# Patient Record
Sex: Female | Born: 1955 | Race: Black or African American | Hispanic: No | Smoking: Never smoker
Health system: Southern US, Community
[De-identification: ages and names within clinical notes are randomized; demographics above are authoritative.]

## PROBLEM LIST (undated history)

## (undated) DIAGNOSIS — I10 Essential (primary) hypertension: Secondary | ICD-10-CM

## (undated) HISTORY — PX: BREAST EXCISIONAL BIOPSY: SUR124

---

## 2004-02-20 ENCOUNTER — Ambulatory Visit (HOSPITAL_COMMUNITY): Admission: RE | Admit: 2004-02-20 | Discharge: 2004-02-20 | Payer: Self-pay | Admitting: Family Medicine

## 2008-08-20 ENCOUNTER — Emergency Department (HOSPITAL_COMMUNITY): Admission: EM | Admit: 2008-08-20 | Discharge: 2008-08-20 | Payer: Self-pay | Admitting: Emergency Medicine

## 2008-09-16 ENCOUNTER — Ambulatory Visit (HOSPITAL_COMMUNITY): Admission: RE | Admit: 2008-09-16 | Discharge: 2008-09-16 | Payer: Self-pay | Admitting: Gastroenterology

## 2008-09-16 ENCOUNTER — Encounter (INDEPENDENT_AMBULATORY_CARE_PROVIDER_SITE_OTHER): Payer: Self-pay | Admitting: Gastroenterology

## 2008-12-02 ENCOUNTER — Encounter (INDEPENDENT_AMBULATORY_CARE_PROVIDER_SITE_OTHER): Payer: Self-pay | Admitting: Obstetrics and Gynecology

## 2008-12-02 ENCOUNTER — Ambulatory Visit (HOSPITAL_COMMUNITY): Admission: RE | Admit: 2008-12-02 | Discharge: 2008-12-02 | Payer: Self-pay | Admitting: Obstetrics and Gynecology

## 2009-06-26 ENCOUNTER — Emergency Department (HOSPITAL_COMMUNITY): Admission: EM | Admit: 2009-06-26 | Discharge: 2009-06-26 | Payer: Self-pay | Admitting: Family Medicine

## 2010-09-20 ENCOUNTER — Other Ambulatory Visit: Payer: Self-pay | Admitting: Family Medicine

## 2010-09-20 DIAGNOSIS — Z1231 Encounter for screening mammogram for malignant neoplasm of breast: Secondary | ICD-10-CM

## 2010-11-14 LAB — CBC
HCT: 34.2 % — ABNORMAL LOW (ref 36.0–46.0)
Hemoglobin: 11.4 g/dL — ABNORMAL LOW (ref 12.0–15.0)
MCHC: 33.5 g/dL (ref 30.0–36.0)
MCV: 78.5 fL (ref 78.0–100.0)
RBC: 4.36 MIL/uL (ref 3.87–5.11)
WBC: 6.1 10*3/uL (ref 4.0–10.5)

## 2010-11-14 LAB — BASIC METABOLIC PANEL
CO2: 29 mEq/L (ref 19–32)
Calcium: 9.5 mg/dL (ref 8.4–10.5)
Chloride: 105 mEq/L (ref 96–112)
GFR calc Af Amer: >60 mL/min (ref >60)
GFR calc non Af Amer: >60 mL/min (ref >60)
Sodium: 138 mEq/L (ref 135–145)

## 2010-11-14 LAB — HCG, SERUM, QUALITATIVE: Preg, Serum: NEGATIVE

## 2010-11-19 LAB — URINE MICROSCOPIC-ADD ON

## 2010-11-19 LAB — CBC
HCT: 37.7 % (ref 36.0–46.0)
Hemoglobin: 12 g/dL (ref 12.0–15.0)
MCHC: 31.9 g/dL (ref 30.0–36.0)
Platelets: 226 10*3/uL (ref 150–400)
RBC: 4.76 MIL/uL (ref 3.87–5.11)

## 2010-11-19 LAB — URINALYSIS, ROUTINE W REFLEX MICROSCOPIC
Glucose, UA: NEGATIVE mg/dL
Glucose, UA: NEGATIVE mg/dL
Ketones, ur: 15 mg/dL — AB
Nitrite: NEGATIVE
Nitrite: NEGATIVE
Urobilinogen, UA: 1 mg/dL (ref 0.0–1.0)

## 2010-11-19 LAB — POCT I-STAT, CHEM 8
Calcium, Ion: 1.19 mmol/L (ref 1.12–1.32)
Glucose, Bld: 111 mg/dL — ABNORMAL HIGH (ref 70–99)
Sodium: 138 mEq/L (ref 135–145)
TCO2: 26 mmol/L (ref 0–100)

## 2010-11-19 LAB — DIFFERENTIAL
Eosinophils Absolute: 0.1 10*3/uL (ref 0.0–0.7)
Eosinophils Relative: 1 % (ref 0–5)
Monocytes Relative: 5 % (ref 3–12)
Neutrophils Relative %: 82 % — ABNORMAL HIGH (ref 43–77)

## 2010-12-18 NOTE — Op Note (Signed)
NAME:  Kimberly Parker, Kimberly Parker NO.:  [id number]   MEDICAL RECORD NO.:  [id number]          PATIENT TYPE:  AMB   LOCATION:  SDC                           FACILITY:  [facility]   PHYSICIAN:  Osborn Coho, M.D.   DATE OF BIRTH:  1956/03/21   DATE OF PROCEDURE:  12/02/2008  DATE OF DISCHARGE:                               OPERATIVE REPORT   PREOPERATIVE DIAGNOSES:  1. Menorrhagia.  2. Dysmenorrhea.  3. Fibroids.  4. Polyp.   POSTOPERATIVE DIAGNOSES:  1. Menorrhagia.  2. Dysmenorrhea.  3. Fibroids.  4. Polyp.   PROCEDURE:  1. Hysteroscopy.  2. D and C.  3. Hydrothermal ablation.   ATTENDING DOCTOR:  Osborn Coho, MD   ANESTHESIA:  General via LMA.   FINDINGS:  Approximately 2 cm submucosal fibroid in appearance on the  anterior uterine wall.   SPECIMEN TO PATHOLOGY:  Endometrial curettings.   FLUIDS:  500 mL.   URINE OUTPUT:  Quantity sufficient via straight cath prior to procedure.   ESTIMATED BLOOD LOSS:  Minimal.   COMPLICATIONS:  None.   PROCEDURE:  The patient was taken to the operating room after risks,  benefits, and alternatives discussed with the patient.  The patient  verbalized understanding, consent signed and witnessed.  The patient was  placed under general anesthesia and prepped and draped in the normal  sterile fashion in the dorsolithotomy position.  A bivalve speculum was  placed in the patient's vagina and the anterior lip of the cervix  grasped with single-tooth tenaculum.  A paracervical block was  administered using a total of 10 mL of 1% lidocaine.  The uterus sounded  to approximate 11 cm and the cervix was dilated for passage of the  hydrothermal ablation hysteroscope.  The hysteroscope was introduced and  submucosal fibroid in appearance as noted above.  The instrument was  removed and curettage was performed and curettings sent to pathology.  The hydrothermal ablation instrument was introduced again into the  uterine cavity and  ablation performed without difficulty.  There was  small area in the fundus which did not appear to be ablated on the  initial portion of the procedure which was then ablated with an  additional 4 minutes of the procedure.  Good results were noted.  All  instruments were removed and good hemostasis at tenaculum site after  pressure applied.  Count was correct.  The patient tolerated the  procedure well and is currently awaiting transfer to the recovery room  in good condition.       Osborn Coho, M.D.  Electronically Signed     AR/MEDQ  D:  12/02/2008  T:  12/03/2008  Job:  [id number]

## 2010-12-18 NOTE — Op Note (Signed)
NAME:  Kimberly Parker, Kimberly Parker NO.:  [id number]   MEDICAL RECORD NO.:  [id number]          PATIENT TYPE:  AMB   LOCATION:  ENDO                         FACILITY:  [facility]   PHYSICIAN:  Bernette Redbird, M.D.   DATE OF BIRTH:  1956-01-19   DATE OF PROCEDURE:  09/16/2008  DATE OF DISCHARGE:                               OPERATIVE REPORT   PROCEDURE:  Colonoscopy with biopsy.   INDICATIONS:  Initial colon cancer screening examination in this 55-year-  old [place] American female who has a family history of colon cancer in  her mother at an advanced age.   FINDINGS:  Essentially normal exam.  Tiny rectal polyp biopsied.   PROCEDURE:  The nature, purpose, and risks of the procedure had been  discussed with the patient who provided written consent.  Sedation was  fentanyl 75 mcg and Versed 7.5 mg IV without arrhythmias or  desaturation.   The Pentax pediatric video colonoscope was advanced with some looping,  overcome by turning the patient into the supine position and using some  external abdominal compression.  The terminal ileum was entered and  looked normal and pullback was then performed.  The appendiceal orifice  was clearly identified as well.   There was a tiny 2-3 mm sessile polyp of the proximal rectum removed by  a single cold biopsy.  No other polyps were seen and there was no  evidence of cancer, colitis, vascular malformations or diverticulosis.  Retroflexion in the rectum was unremarkable.  The patient tolerated the  procedure well and there were no apparent complications.   IMPRESSION:  Essentially normal colonoscopy in a patient with a family  history of colon cancer.  Diminutive rectal polyp biopsied as described  above.   PLAN:  Await pathology results.  Anticipate repeat colonoscopy in 5  years in view of the family history.           ______________________________  Bernette Redbird, M.D.     RB/MEDQ  D:  09/16/2008  T:  09/17/2008  Job:   [id number]   cc:   Carren Rang, M.D.

## 2013-11-18 ENCOUNTER — Encounter (HOSPITAL_COMMUNITY): Payer: Self-pay | Admitting: Emergency Medicine

## 2013-11-18 ENCOUNTER — Emergency Department (HOSPITAL_COMMUNITY)
Admission: EM | Admit: 2013-11-18 | Discharge: 2013-11-18 | Disposition: A | Discharge status: Home / Self Care | Payer: 59 | Source: Home / Self Care | Attending: Family Medicine | Admitting: Family Medicine

## 2013-11-18 DIAGNOSIS — M76899 Other specified enthesopathies of unspecified lower limb, excluding foot: Secondary | ICD-10-CM

## 2013-11-18 DIAGNOSIS — M7061 Trochanteric bursitis, right hip: Secondary | ICD-10-CM

## 2013-11-18 MED ORDER — PREDNISONE 10 MG PO KIT: PACK | ORAL | Status: AC

## 2013-11-18 NOTE — ED Notes (Signed)
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

## 2013-11-18 NOTE — Discharge Instructions (Signed)
Thank you for coming in today. Do the exercises we talked about multiple times a day.  Take prednisone as directed Followup with Dr. [facility] if not getting better  Hip Bursitis Bursitis is a swelling and soreness (inflammation) of a fluid-filled sac (bursa). This sac overlies and protects the joints.  CAUSES   Injury.  Overuse of the muscles surrounding the joint.  Arthritis.  Gout.  Infection.  Cold weather.  Inadequate warm-up and conditioning prior to activities. The cause may not be known.  SYMPTOMS   Mild to severe irritation.  Tenderness and swelling over the outside of the hip.  Pain with motion of the hip.  If the bursa becomes infected, a fever may be present. Redness, tenderness, and warmth will develop over the hip. Symptoms usually lessen in 3 to 4 weeks with treatment, but can come back. TREATMENT If conservative treatment does not work, your caregiver may advise draining the bursa and injecting cortisone into the area. This may speed up the healing process. This may also be used as an initial treatment of choice. HOME CARE INSTRUCTIONS   Apply ice to the affected area for 15-20 minutes every 3 to 4 hours while awake for the first 2 days. Put the ice in a plastic bag and place a towel between the bag of ice and your skin.  Rest the painful joint as much as possible, but continue to put the joint through a normal range of motion at least 4 times per day. When the pain lessens, begin normal, slow movements and usual activities to help prevent stiffness of the hip.  Only take over-the-counter or prescription medicines for pain, discomfort, or fever as directed by your caregiver.  Use crutches to limit weight bearing on the hip joint, if advised.  Elevate your painful hip to reduce swelling. Use pillows for propping and cushioning your legs and hips.  Gentle massage may provide comfort and decrease swelling. SEEK IMMEDIATE MEDICAL CARE  IF:   Your pain increases even during treatment, or you are not improving.  You have a fever.  You have heat and inflammation over the involved bursa.  You have any other questions or concerns. MAKE SURE YOU:   Understand these instructions.  Will watch your condition.  Will get help right away if you are not doing well or get worse. Document Released: 01/11/2002 Document Revised: 10/14/2011 Document Reviewed: 08/10/2008 [facility] Patient Information 2014 [employer] [place].

## 2013-11-18 NOTE — ED Provider Notes (Signed)
Kimberly Parker is a 58 y.o. female who presents to Urgent Care today for right lateral hip pain present for 2 days without injury. Pain is worse when rising from a seated position, climbing stairs, and when laying on the right side. No radiating pain weakness or numbness. No medications tried. Patient feels well otherwise. Pain is moderate.   History reviewed. No pertinent past medical history. History  Substance Use Topics  . Smoking status: Not on file  . Smokeless tobacco: Not on file  . Alcohol Use: Not on file   ROS as above Medications: No current facility-administered medications for this encounter.   Current Outpatient Prescriptions  Medication Sig Dispense Refill  . HYDROCHLOROTHIAZIDE PO Take by mouth.      . METOPROLOL TARTRATE PO Take by mouth.      . PredniSONE 10 MG KIT 12 day dose pack po  1 kit  0    Exam:  BP 165/67  Pulse 75  Temp(Src) 98.4 F (36.9 C) (Oral)  Resp 18  SpO2 99% Gen: Well NAD Hip: Right: Normal-appearing full range of motion. Tender palpation right greater trochanter. Hip abduction strength 4/5 Left: Normal-appearing full range of motion. Nontender. Strength 4+/5 abduction.  Capillary refill sensation are intact distally bilateral extremities Normal gait  No results found for this or any previous visit (from the past 24 hour(s)). No results found.  Assessment and Plan: 58 y.o. female with right greater trochanteric bursitis. Plan to treat with prednisone, and home exercise program. Followup as needed.  Discussed warning signs or symptoms. Please see discharge instructions. Patient expresses understanding.    Gregor Hams, MD 11/18/13 [phone]

## 2014-09-30 ENCOUNTER — Other Ambulatory Visit: Payer: Self-pay

## 2014-09-30 DIAGNOSIS — Z1231 Encounter for screening mammogram for malignant neoplasm of breast: Secondary | ICD-10-CM

## 2014-10-10 ENCOUNTER — Other Ambulatory Visit (HOSPITAL_COMMUNITY): Payer: Self-pay | Admitting: Emergency Medicine

## 2014-10-10 DIAGNOSIS — E0590 Thyrotoxicosis, unspecified without thyrotoxic crisis or storm: Secondary | ICD-10-CM

## 2014-10-13 ENCOUNTER — Ambulatory Visit (HOSPITAL_COMMUNITY): Payer: 59

## 2014-10-14 ENCOUNTER — Other Ambulatory Visit (HOSPITAL_COMMUNITY): Payer: 59

## 2014-10-27 ENCOUNTER — Encounter (HOSPITAL_COMMUNITY)
Admission: RE | Admit: 2014-10-27 | Discharge: 2014-10-27 | Disposition: A | Discharge status: Home / Self Care | Payer: 59 | Source: Ambulatory Visit | Attending: Emergency Medicine | Admitting: Emergency Medicine

## 2014-10-27 DIAGNOSIS — E0590 Thyrotoxicosis, unspecified without thyrotoxic crisis or storm: Secondary | ICD-10-CM | POA: Insufficient documentation

## 2014-10-27 MED ORDER — SODIUM IODIDE I 131 CAPSULE
10.2000 | Freq: Once | INTRAVENOUS | Status: AC | PRN
  Administered 2014-10-27: 10.2 via ORAL

## 2014-10-28 ENCOUNTER — Ambulatory Visit
Admission: RE | Admit: 2014-10-28 | Discharge: 2014-10-28 | Disposition: A | Discharge status: Home / Self Care | Payer: 59 | Source: Ambulatory Visit

## 2014-10-28 ENCOUNTER — Encounter (HOSPITAL_COMMUNITY)
Admission: RE | Admit: 2014-10-28 | Discharge: 2014-10-28 | Disposition: A | Discharge status: Home / Self Care | Payer: 59 | Source: Ambulatory Visit | Attending: Emergency Medicine | Admitting: Emergency Medicine

## 2014-10-28 DIAGNOSIS — E0590 Thyrotoxicosis, unspecified without thyrotoxic crisis or storm: Secondary | ICD-10-CM | POA: Diagnosis present

## 2014-10-28 DIAGNOSIS — Z1231 Encounter for screening mammogram for malignant neoplasm of breast: Secondary | ICD-10-CM

## 2014-10-28 MED ORDER — SODIUM PERTECHNETATE TC 99M INJECTION
10.0000 | Freq: Once | INTRAVENOUS | Status: AC | PRN
  Administered 2014-10-28: 10 via INTRAVENOUS

## 2015-08-16 MED FILL — HYDROCHLOROTHIAZIDE 25 MG T: 25 | 90 days supply | Qty: 90 | Fill #3

## 2015-09-18 ENCOUNTER — Other Ambulatory Visit: Payer: Self-pay

## 2015-09-18 DIAGNOSIS — Z1231 Encounter for screening mammogram for malignant neoplasm of breast: Secondary | ICD-10-CM

## 2015-10-14 DIAGNOSIS — Z13228 Encounter for screening for other metabolic disorders: Secondary | ICD-10-CM | POA: Diagnosis not present

## 2015-10-14 DIAGNOSIS — Z0001 Encounter for general adult medical examination with abnormal findings: Secondary | ICD-10-CM | POA: Diagnosis not present

## 2015-10-14 DIAGNOSIS — I10 Essential (primary) hypertension: Secondary | ICD-10-CM | POA: Diagnosis not present

## 2015-10-14 DIAGNOSIS — E559 Vitamin D deficiency, unspecified: Secondary | ICD-10-CM | POA: Diagnosis not present

## 2015-10-16 MED FILL — METOPROLOL TARTRATE 50 MG T: 50 | 90 days supply | Qty: 90 | Fill #0

## 2015-11-14 MED FILL — HYDROCHLOROTHIAZIDE 25 MG T: 25 | 90 days supply | Qty: 90 | Fill #0

## 2015-11-16 ENCOUNTER — Ambulatory Visit
Admission: RE | Admit: 2015-11-16 | Discharge: 2015-11-16 | Disposition: A | Discharge status: Home / Self Care | Payer: 59 | Source: Ambulatory Visit

## 2015-11-16 DIAGNOSIS — Z1231 Encounter for screening mammogram for malignant neoplasm of breast: Secondary | ICD-10-CM | POA: Diagnosis not present

## 2016-01-15 MED FILL — METOPROLOL TARTRATE 50 MG T: 50 | 90 days supply | Qty: 90 | Fill #1

## 2016-01-18 DIAGNOSIS — H524 Presbyopia: Secondary | ICD-10-CM | POA: Diagnosis not present

## 2016-02-20 MED FILL — HYDROCHLOROTHIAZIDE 25 MG T: 25 | 90 days supply | Qty: 90 | Fill #1

## 2016-02-26 ENCOUNTER — Ambulatory Visit (HOSPITAL_COMMUNITY)
Admission: EM | Admit: 2016-02-26 | Discharge: 2016-02-26 | Disposition: A | Discharge status: Home / Self Care | Payer: 59 | Attending: Emergency Medicine | Admitting: Emergency Medicine

## 2016-02-26 ENCOUNTER — Encounter (HOSPITAL_COMMUNITY): Payer: Self-pay | Admitting: *Deleted

## 2016-02-26 DIAGNOSIS — M79604 Pain in right leg: Secondary | ICD-10-CM

## 2016-02-26 DIAGNOSIS — M79609 Pain in unspecified limb: Secondary | ICD-10-CM

## 2016-02-26 DIAGNOSIS — M1990 Unspecified osteoarthritis, unspecified site: Secondary | ICD-10-CM | POA: Diagnosis not present

## 2016-02-26 DIAGNOSIS — M79605 Pain in left leg: Secondary | ICD-10-CM

## 2016-02-26 HISTORY — DX: Essential (primary) hypertension: I10

## 2016-02-26 MED ORDER — KETOROLAC TROMETHAMINE 60 MG/2ML IM SOLN
INTRAMUSCULAR | Status: AC
  Filled 2016-02-26: qty 2

## 2016-02-26 MED ORDER — NAPROXEN 500 MG PO TABS: 500.0000 mg | ORAL_TABLET | Freq: Two times a day (BID) | ORAL | 0 refills | Status: AC

## 2016-02-26 MED ORDER — NAPROXEN 500 MG PO TABS
500.0000 mg | ORAL_TABLET | Freq: Two times a day (BID) | ORAL | 0 refills | Status: AC
Start: 2016-02-26 — End: ?

## 2016-02-26 MED ORDER — KETOROLAC TROMETHAMINE 60 MG/2ML IM SOLN
60.0000 mg | Freq: Once | INTRAMUSCULAR | Status: AC
  Administered 2016-02-26: 60 mg via INTRAMUSCULAR

## 2016-02-26 NOTE — ED Triage Notes (Signed)
Pt  Reports    Pain   Behind  Both  Knees   X  1  Week      She  Noticed  As   Well  Some  Swelling behind  The  Knees

## 2016-02-26 NOTE — ED Provider Notes (Signed)
CSN: [id number]     Arrival date & time 02/26/16  1535 History   None    Chief Complaint  Patient presents with  . Leg Pain   (Consider location/radiation/quality/duration/timing/severity/associated sxs/prior Treatment) Patient c/o bilateral popliteal pain and posterior knee discomfort for 2 days.  She has to do a lot of walking at her job and it has been difficult with the posterior knee pain. She denies any specific injury.   The history is provided by the patient.  Leg Pain  Location:  Leg Time since incident:  2 days Injury: no   Leg location:  L leg and R leg Pain details:    Quality:  Aching   Radiates to:  Does not radiate   Severity:  Moderate   Onset quality:  Sudden   Duration:  2 days   Timing:  Constant   Progression:  Waxing and waning Chronicity:  New Dislocation: no   Foreign body present:  No foreign bodies Tetanus status:  Unknown Prior injury to area:  No Relieved by:  Nothing Worsened by:  Nothing Ineffective treatments:  None tried   Past Medical History:  Diagnosis Date  . Hypertension    History reviewed. No pertinent surgical history. History reviewed. No pertinent family history. Social History  Substance Use Topics  . Smoking status: Never Smoker  . Smokeless tobacco: Never Used  . Alcohol use Yes   OB History    No data available     Review of Systems  Constitutional: Negative.   HENT: Negative.   Eyes: Negative.   Respiratory: Negative.   Cardiovascular: Negative.   Gastrointestinal: Negative.   Endocrine: Negative.   Genitourinary: Negative.   Musculoskeletal: Positive for arthralgias and myalgias.  Skin: Negative.   Allergic/Immunologic: Negative.   Neurological: Negative.   Hematological: Negative.   Psychiatric/Behavioral: Negative.     Allergies  Review of patient's allergies indicates no known allergies.  Home Medications   Prior to Admission medications   Medication Sig Start Date End Date Taking? Authorizing  Provider  HYDROCHLOROTHIAZIDE PO Take by mouth.    Historical Provider, MD  METOPROLOL TARTRATE PO Take by mouth.    Historical Provider, MD  naproxen (NAPROSYN) 500 MG tablet Take 1 tablet (500 mg total) by mouth 2 (two) times daily with a meal. 02/26/16   Lysbeth Penner, FNP  naproxen (NAPROSYN) 500 MG tablet Take 1 tablet (500 mg total) by mouth 2 (two) times daily with a meal. 02/26/16   Lysbeth Penner, FNP  PredniSONE 10 MG KIT 12 day dose pack po 11/18/13   Gregor Hams, MD   Meds Ordered and Administered this Visit   Medications  ketorolac (TORADOL) injection 60 mg (not administered)    BP 163/93 (BP Location: Left Arm)   Pulse 73   Temp 98.2 F (36.8 C) (Oral)   Resp 16   SpO2 100%  No data found.   Physical Exam  Constitutional: She appears well-developed and well-nourished.  HENT:  Head: Normocephalic.  Eyes: Conjunctivae and EOM are normal. Pupils are equal, round, and reactive to light.  Neck: Normal range of motion. Neck supple.  Cardiovascular: Normal rate, regular rhythm and normal heart sounds.   Pulmonary/Chest: Effort normal and breath sounds normal.  Abdominal: Soft. Bowel sounds are normal.  Musculoskeletal: She exhibits edema and tenderness.  Bilateral popliteal areas with swelling and tenderness.    Urgent Care Course   Clinical Course    Procedures (including critical care time)  Labs  Review Labs Reviewed - No data to display  Imaging Review No results found.   Visual Acuity Review  Right Eye Distance:   Left Eye Distance:   Bilateral Distance:    Right Eye Near:   Left Eye Near:    Bilateral Near:         MDM   1. Popliteal pain   2. Bilateral leg pain   3. Arthritis   Explained could be from bakers cysts bilateral and would take anti-inflammatories to start with. 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     WiLysbeth PennerFNP 02/26/16 17[place] FNWellington7/24/17 1755

## 2016-02-28 MED FILL — NAPROXEN 500 MG TABLET: 500 | 10 days supply | Qty: 20 | Fill #0

## 2016-03-04 DIAGNOSIS — E0520 Thyrotoxicosis with toxic multinodular goiter without thyrotoxic crisis or storm: Secondary | ICD-10-CM | POA: Diagnosis not present

## 2016-04-15 MED FILL — METOPROLOL TARTRATE 50 MG T: 50 | 90 days supply | Qty: 90 | Fill #2

## 2016-05-14 MED FILL — HYDROCHLOROTHIAZIDE 25 MG T: 25 | 90 days supply | Qty: 90 | Fill #2

## 2016-06-22 DIAGNOSIS — B372 Candidiasis of skin and nail: Secondary | ICD-10-CM | POA: Diagnosis not present

## 2016-06-24 MED FILL — FLUCONAZOLE 150 MG TABLET: 150 | 10 days supply | Qty: 6 | Fill #0

## 2016-06-24 MED FILL — KETOCONAZOLE 2% CREAM: 2 | 15 days supply | Qty: 30 | Fill #0

## 2016-07-17 MED FILL — METOPROLOL TARTRATE 50 MG T: 50 | 90 days supply | Qty: 90 | Fill #0

## 2016-08-01 DIAGNOSIS — L282 Other prurigo: Secondary | ICD-10-CM | POA: Diagnosis not present

## 2016-08-01 DIAGNOSIS — L409 Psoriasis, unspecified: Secondary | ICD-10-CM | POA: Diagnosis not present

## 2016-08-02 MED FILL — BETAMETHASONE DP 0.05% CRM: 0.05 | 30 days supply | Qty: 45 | Fill #0

## 2016-08-02 MED FILL — DESONIDE 0.05% CREAM: 0.05 | 30 days supply | Qty: 60 | Fill #0

## 2016-08-02 MED FILL — predniSONE 10 MG TABS: 10 | 6 days supply | Qty: 21 | Fill #0

## 2016-08-02 MED FILL — CALCIPOTRIENE 0.005% OINT: 0.005 | 30 days supply | Qty: 60 | Fill #0

## 2016-08-14 MED FILL — HYDROCHLOROTHIAZIDE 25 MG T: 25 | 90 days supply | Qty: 90 | Fill #3

## 2016-10-03 ENCOUNTER — Other Ambulatory Visit: Payer: Self-pay | Admitting: *Deleted

## 2016-10-03 DIAGNOSIS — Z1231 Encounter for screening mammogram for malignant neoplasm of breast: Secondary | ICD-10-CM

## 2016-10-10 MED FILL — METOPROLOL TARTRATE 50 MG T: 50 | 90 days supply | Qty: 90 | Fill #1

## 2016-11-18 ENCOUNTER — Ambulatory Visit
Admission: RE | Admit: 2016-11-18 | Discharge: 2016-11-18 | Disposition: A | Discharge status: Home / Self Care | Payer: 59 | Source: Ambulatory Visit | Attending: *Deleted | Admitting: *Deleted

## 2016-11-18 DIAGNOSIS — Z1231 Encounter for screening mammogram for malignant neoplasm of breast: Secondary | ICD-10-CM | POA: Diagnosis not present

## 2016-11-18 DIAGNOSIS — E559 Vitamin D deficiency, unspecified: Secondary | ICD-10-CM | POA: Diagnosis not present

## 2016-11-18 DIAGNOSIS — I10 Essential (primary) hypertension: Secondary | ICD-10-CM | POA: Diagnosis not present

## 2016-11-18 MED FILL — HYDROCHLOROTHIAZIDE 25 MG T: 25 | 90 days supply | Qty: 90 | Fill #0

## 2017-01-13 MED FILL — METOPROLOL TARTRATE 50 MG T: 50 | 90 days supply | Qty: 90 | Fill #2

## 2017-02-17 MED FILL — HYDROCHLOROTHIAZIDE 25 MG T: 25 | 90 days supply | Qty: 90 | Fill #1

## 2017-03-11 DIAGNOSIS — E0520 Thyrotoxicosis with toxic multinodular goiter without thyrotoxic crisis or storm: Secondary | ICD-10-CM | POA: Diagnosis not present

## 2017-04-14 MED FILL — METOPROLOL TARTRATE 50 MG T: 50 | 90 days supply | Qty: 90 | Fill #0

## 2017-05-13 MED FILL — HYDROCHLOROTHIAZIDE 25 MG T: 25 | 90 days supply | Qty: 90 | Fill #2

## 2017-07-16 MED FILL — METOPROLOL TARTRATE 50 MG T: 50 | 90 days supply | Qty: 90 | Fill #1

## 2017-08-13 MED FILL — HYDROCHLOROTHIAZIDE 25 MG T: 25 | 90 days supply | Qty: 90 | Fill #3

## 2017-10-13 MED FILL — METOPROLOL TARTRATE 50 MG T: 50 | 90 days supply | Qty: 90 | Fill #2

## 2017-11-06 MED FILL — HYDROCHLOROTHIAZIDE 25 MG T: 25 | 90 days supply | Qty: 90 | Fill #0

## 2017-11-20 DIAGNOSIS — Z0000 Encounter for general adult medical examination without abnormal findings: Secondary | ICD-10-CM | POA: Diagnosis not present

## 2017-11-20 DIAGNOSIS — Z13228 Encounter for screening for other metabolic disorders: Secondary | ICD-10-CM | POA: Diagnosis not present

## 2017-11-24 DIAGNOSIS — E0590 Thyrotoxicosis, unspecified without thyrotoxic crisis or storm: Secondary | ICD-10-CM | POA: Diagnosis not present

## 2017-11-24 DIAGNOSIS — I10 Essential (primary) hypertension: Secondary | ICD-10-CM | POA: Diagnosis not present

## 2017-11-24 DIAGNOSIS — Z129 Encounter for screening for malignant neoplasm, site unspecified: Secondary | ICD-10-CM | POA: Diagnosis not present

## 2017-11-24 DIAGNOSIS — Z0000 Encounter for general adult medical examination without abnormal findings: Secondary | ICD-10-CM | POA: Diagnosis not present

## 2017-12-26 ENCOUNTER — Other Ambulatory Visit: Payer: Self-pay | Admitting: *Deleted

## 2017-12-26 DIAGNOSIS — Z1231 Encounter for screening mammogram for malignant neoplasm of breast: Secondary | ICD-10-CM

## 2018-01-15 ENCOUNTER — Ambulatory Visit
Admission: RE | Admit: 2018-01-15 | Discharge: 2018-01-15 | Disposition: A | Discharge status: Home / Self Care | Payer: 59 | Source: Ambulatory Visit | Attending: *Deleted | Admitting: *Deleted

## 2018-01-15 DIAGNOSIS — Z1231 Encounter for screening mammogram for malignant neoplasm of breast: Secondary | ICD-10-CM | POA: Diagnosis not present

## 2018-01-15 MED FILL — METOPROLOL TARTRATE 50 MG T: 50 | 90 days supply | Qty: 90 | Fill #0

## 2018-02-09 MED FILL — HYDROCHLOROTHIAZIDE 25 MG T: 25 | 90 days supply | Qty: 90 | Fill #0

## 2018-02-17 ENCOUNTER — Encounter: Payer: Self-pay | Admitting: Physician Assistant

## 2018-03-16 DIAGNOSIS — E0520 Thyrotoxicosis with toxic multinodular goiter without thyrotoxic crisis or storm: Secondary | ICD-10-CM | POA: Diagnosis not present

## 2018-04-08 MED FILL — METOPROLOL TARTRATE 50 MG T: 50 | 90 days supply | Qty: 90 | Fill #1

## 2018-05-13 MED FILL — HYDROCHLOROTHIAZIDE 25 MG T: 25 | 90 days supply | Qty: 90 | Fill #1

## 2018-07-14 DIAGNOSIS — I10 Essential (primary) hypertension: Secondary | ICD-10-CM | POA: Diagnosis not present

## 2018-07-14 DIAGNOSIS — E0590 Thyrotoxicosis, unspecified without thyrotoxic crisis or storm: Secondary | ICD-10-CM | POA: Diagnosis not present

## 2018-07-14 MED FILL — METOPROLOL TARTRATE 50 MG T: 50 | 90 days supply | Qty: 90 | Fill #0

## 2018-08-10 MED FILL — HYDROCHLOROTHIAZIDE 25 MG T: 25 | 90 days supply | Qty: 90 | Fill #0

## 2018-10-07 MED FILL — METOPROLOL TARTRATE 50 MG T: 50 | 90 days supply | Qty: 90 | Fill #1

## 2018-10-26 MED FILL — HYDROCHLOROTHIAZIDE 25 MG T: 25 | 90 days supply | Qty: 90 | Fill #0 | Status: TO

## 2018-12-09 ENCOUNTER — Other Ambulatory Visit: Payer: Self-pay | Admitting: *Deleted

## 2018-12-09 DIAGNOSIS — Z1231 Encounter for screening mammogram for malignant neoplasm of breast: Secondary | ICD-10-CM

## 2019-01-04 MED FILL — METOPROLOL TARTRATE 50 MG T: 50 | 90 days supply | Qty: 90 | Fill #2

## 2019-02-01 ENCOUNTER — Ambulatory Visit: Payer: 59

## 2019-02-03 ENCOUNTER — Ambulatory Visit
Admission: RE | Admit: 2019-02-03 | Discharge: 2019-02-03 | Disposition: A | Discharge status: Home / Self Care | Payer: 59 | Source: Ambulatory Visit | Attending: *Deleted | Admitting: *Deleted

## 2019-02-03 ENCOUNTER — Other Ambulatory Visit: Payer: Self-pay

## 2019-02-03 DIAGNOSIS — Z1231 Encounter for screening mammogram for malignant neoplasm of breast: Secondary | ICD-10-CM | POA: Diagnosis not present

## 2019-02-03 MED FILL — HYDROCHLOROTHIAZIDE 25 MG T: 25 | 90 days supply | Qty: 90 | Fill #0

## 2019-03-22 DIAGNOSIS — E0520 Thyrotoxicosis with toxic multinodular goiter without thyrotoxic crisis or storm: Secondary | ICD-10-CM | POA: Diagnosis not present

## 2019-04-05 MED FILL — METOPROLOL TARTRATE 50 MG T: 50 | 90 days supply | Qty: 90 | Fill #3

## 2019-05-10 MED FILL — HYDROCHLOROTHIAZIDE 25 MG T: 25 | 90 days supply | Qty: 90 | Fill #1

## 2019-07-05 MED FILL — METOPROLOL TARTRATE 50 MG T: 50 | 30 days supply | Qty: 30 | Fill #0

## 2019-07-09 DIAGNOSIS — Z1159 Encounter for screening for other viral diseases: Secondary | ICD-10-CM | POA: Diagnosis not present

## 2019-07-09 DIAGNOSIS — M1990 Unspecified osteoarthritis, unspecified site: Secondary | ICD-10-CM | POA: Diagnosis not present

## 2019-07-09 DIAGNOSIS — I10 Essential (primary) hypertension: Secondary | ICD-10-CM | POA: Diagnosis not present

## 2019-07-09 DIAGNOSIS — Z124 Encounter for screening for malignant neoplasm of cervix: Secondary | ICD-10-CM | POA: Diagnosis not present

## 2019-07-09 DIAGNOSIS — Z283 Underimmunization status: Secondary | ICD-10-CM | POA: Diagnosis not present

## 2019-07-09 MED FILL — SHINGRIX 50 MCG SUS: 50 | 1 days supply | Qty: 1 | Fill #0

## 2019-07-09 MED FILL — MELOXICAM 15 MG TABLET: 15 | 90 days supply | Qty: 90 | Fill #0

## 2019-08-10 MED FILL — HYDROCHLOROTHIAZIDE 25 MG T: 25 | 90 days supply | Qty: 90 | Fill #0

## 2019-08-10 MED FILL — METOPROLOL TARTRATE 50 MG T: 50 | 90 days supply | Qty: 90 | Fill #0

## 2019-09-09 DIAGNOSIS — H524 Presbyopia: Secondary | ICD-10-CM | POA: Diagnosis not present

## 2019-09-13 MED FILL — SHINGRIX 50 MCG SUS: 50 | 1 days supply | Qty: 1 | Fill #1

## 2019-09-24 MED FILL — SHINGRIX 50 MCG SUS: 50 | 1 days supply | Qty: 1 | Fill #1

## 2019-09-29 MED FILL — MELOXICAM 15 MG TABLET: 15 | 90 days supply | Qty: 90 | Fill #1

## 2019-11-08 MED FILL — METOPROLOL TARTRATE 50 MG T: 50 | 90 days supply | Qty: 90 | Fill #1

## 2019-11-08 MED FILL — HYDROCHLOROTHIAZIDE 25 MG T: 25 | 90 days supply | Qty: 90 | Fill #1

## 2020-01-05 ENCOUNTER — Other Ambulatory Visit: Payer: Self-pay | Admitting: *Deleted

## 2020-01-05 DIAGNOSIS — Z1231 Encounter for screening mammogram for malignant neoplasm of breast: Secondary | ICD-10-CM

## 2020-01-19 ENCOUNTER — Other Ambulatory Visit (HOSPITAL_COMMUNITY): Payer: Self-pay | Admitting: *Deleted

## 2020-01-19 DIAGNOSIS — I10 Essential (primary) hypertension: Secondary | ICD-10-CM | POA: Diagnosis not present

## 2020-01-19 DIAGNOSIS — E0590 Thyrotoxicosis, unspecified without thyrotoxic crisis or storm: Secondary | ICD-10-CM | POA: Diagnosis not present

## 2020-01-19 DIAGNOSIS — M1990 Unspecified osteoarthritis, unspecified site: Secondary | ICD-10-CM | POA: Diagnosis not present

## 2020-01-19 MED FILL — MELOXICAM 15 MG TABLET: 15 | 90 days supply | Qty: 90 | Fill #0

## 2020-01-19 MED FILL — HYDROCHLOROTHIAZIDE 25 MG T: 25 | 90 days supply | Qty: 90 | Fill #0

## 2020-01-19 MED FILL — METOPROLOL TARTRATE 50 MG T: 50 | 90 days supply | Qty: 90 | Fill #0

## 2020-03-03 ENCOUNTER — Ambulatory Visit
Admission: RE | Admit: 2020-03-03 | Discharge: 2020-03-03 | Disposition: A | Discharge status: Home / Self Care | Payer: 59 | Source: Ambulatory Visit | Attending: *Deleted | Admitting: *Deleted

## 2020-03-03 ENCOUNTER — Other Ambulatory Visit: Payer: Self-pay

## 2020-03-03 DIAGNOSIS — Z1231 Encounter for screening mammogram for malignant neoplasm of breast: Secondary | ICD-10-CM | POA: Diagnosis not present

## 2020-05-08 MED FILL — HYDROCHLOROTHIAZIDE 25 MG T: 25 | 90 days supply | Qty: 90 | Fill #1

## 2020-05-08 MED FILL — MELOXICAM 15 MG TABLET: 15 | 90 days supply | Qty: 90 | Fill #1

## 2020-05-08 MED FILL — METOPROLOL TARTRATE 50 MG T: 50 | 90 days supply | Qty: 90 | Fill #1

## 2020-05-30 ENCOUNTER — Other Ambulatory Visit (HOSPITAL_COMMUNITY): Payer: Self-pay | Admitting: Internal Medicine

## 2020-05-30 MED FILL — FLUAD QUADRIVALENT 0.5 ML P: 0.5 | 1 days supply | Qty: 1 | Fill #0

## 2020-08-13 IMAGING — MG DIGITAL SCREENING BILAT W/ TOMO W/ CAD
8 series · 8 of 24 positions shown · non-contrast
Comparison: Previous exam(s).

CLINICAL DATA: Screening.

EXAM:
DIGITAL SCREENING BILATERAL MAMMOGRAM WITH TOMO AND CAD

[R MLO synth-2D]
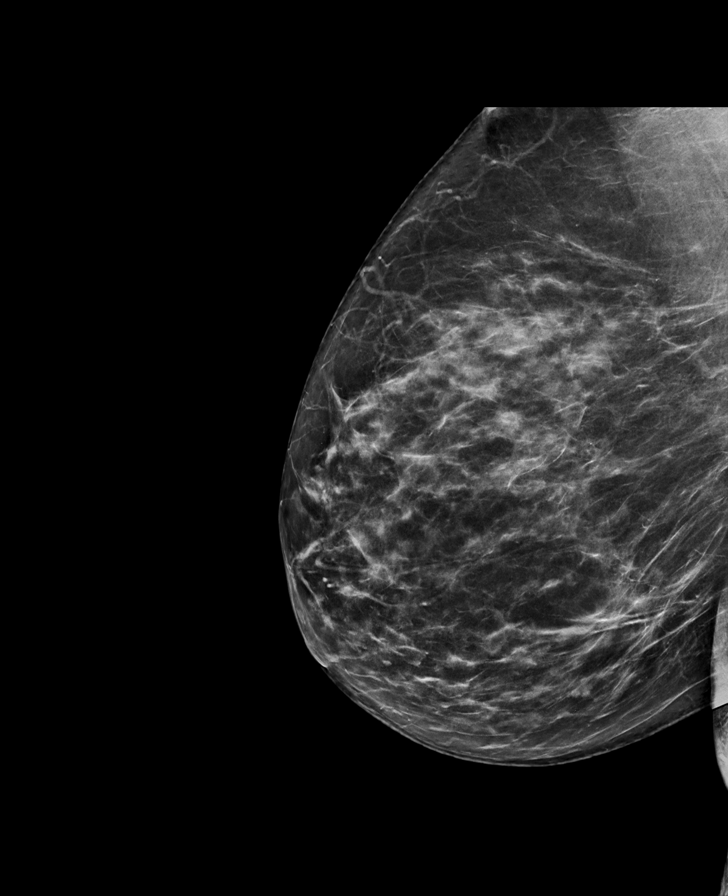

[L MLO synth-2D]
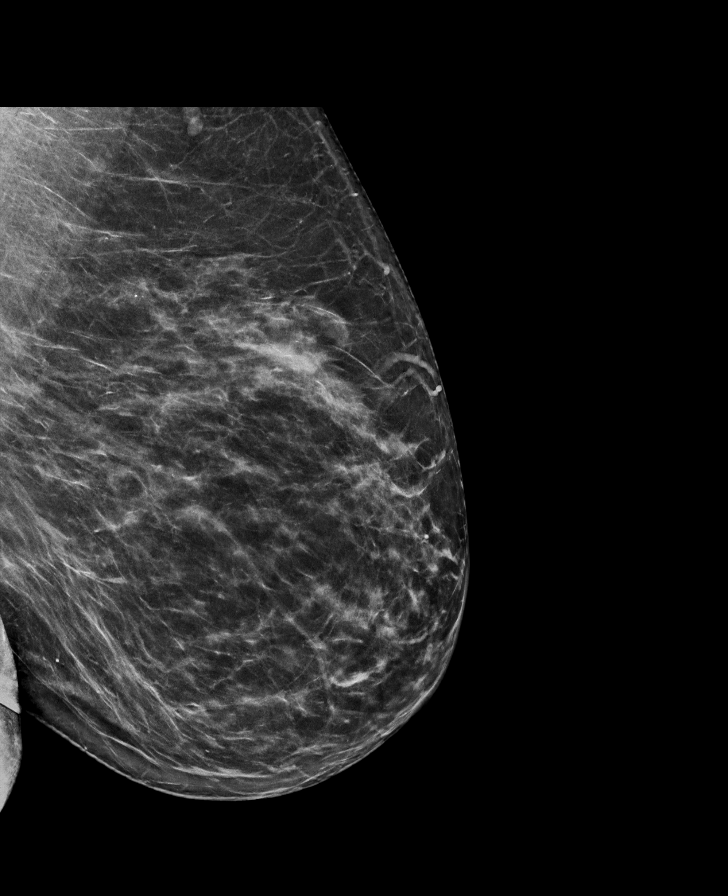

[L CC synth-2D]
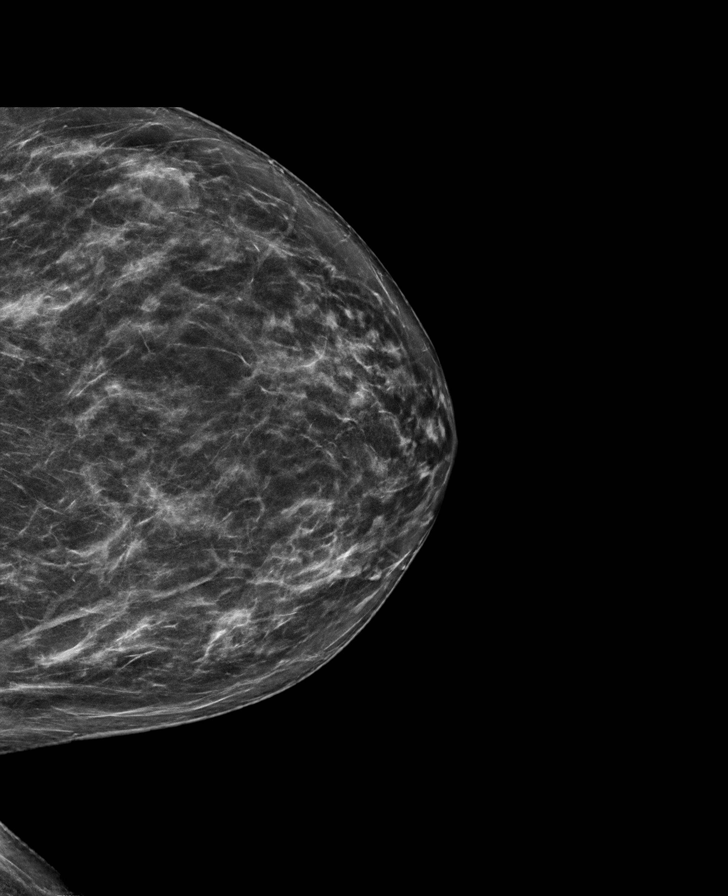

[R CC synth-2D]
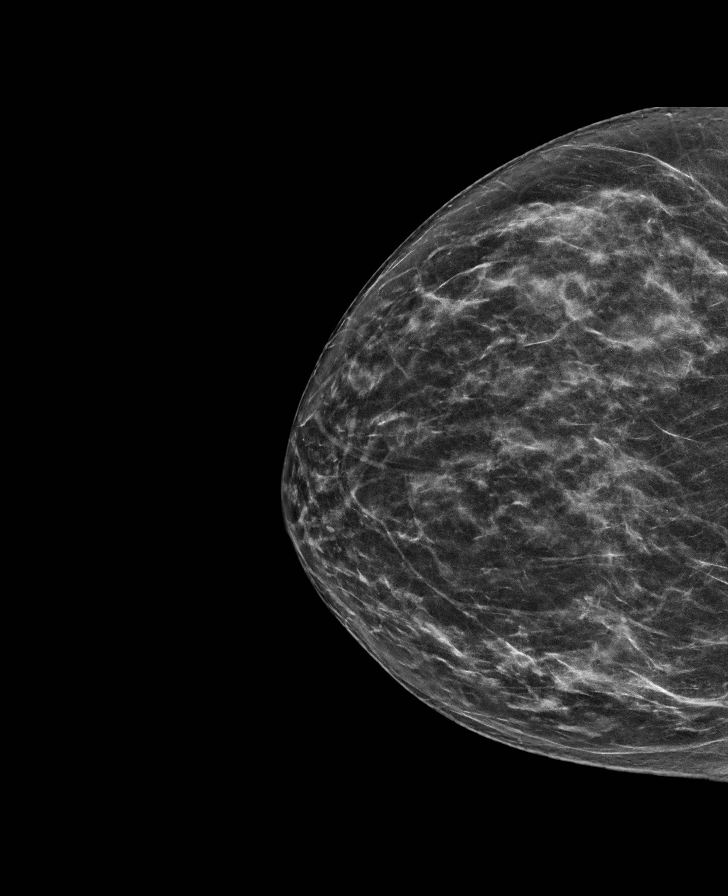

[R MLO tomo · tomo slice 39/78.0]
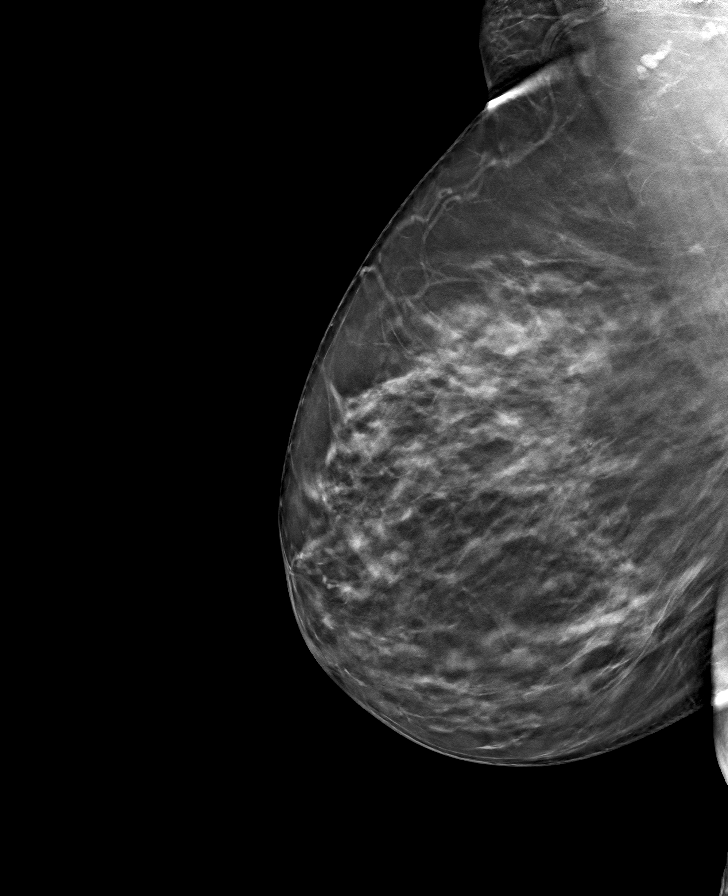

[L CC tomo · tomo slice 36/71.0]
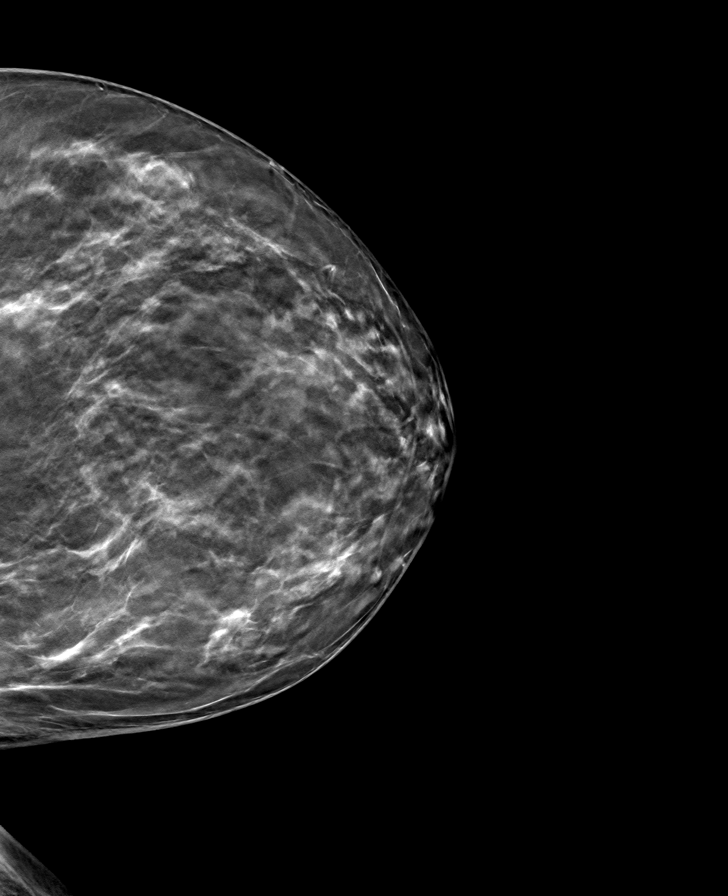

[L MLO tomo · tomo slice 38/75.0]
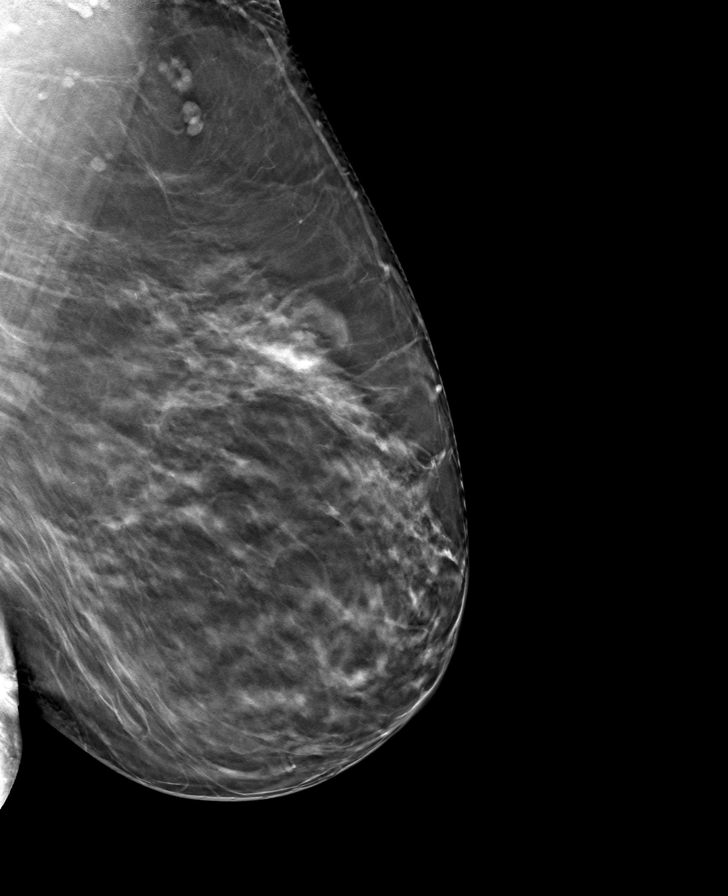

[R CC tomo · tomo slice 34/67.0]
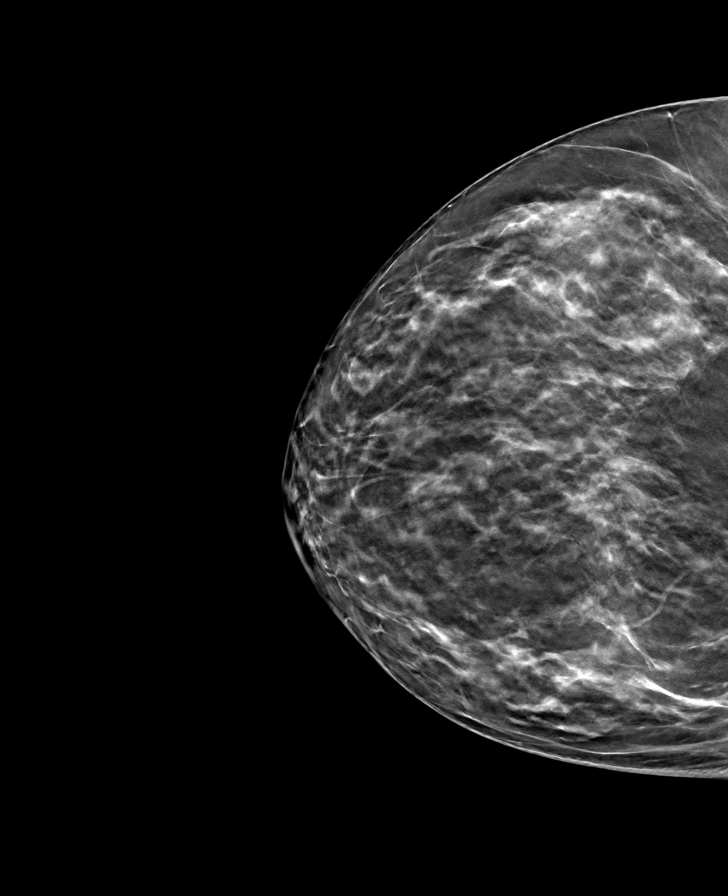

[8 of 24 positions shown; findings below may reference images not displayed]

ACR Breast Density Category c: The breast tissue is heterogeneously
dense, which may obscure small masses.
FINDINGS: There are no findings suspicious for malignancy. Images were
processed with CAD.
IMPRESSION: No mammographic evidence of malignancy. A result letter of this
screening mammogram will be mailed directly to the patient.

RECOMMENDATION:
Screening mammogram in one year. (Code:FT-U-LHB)

BI-RADS CATEGORY  1: Negative.

## 2020-08-14 MED FILL — METOPROLOL TARTRATE 50 MG T: 50 | 90 days supply | Qty: 90 | Fill #2

## 2020-08-14 MED FILL — HYDROCHLOROTHIAZIDE 25 MG T: 25 | 90 days supply | Qty: 90 | Fill #2

## 2020-08-14 MED FILL — MELOXICAM 15 MG TABLET: 15 | 90 days supply | Qty: 90 | Fill #2

## 2020-11-20 ENCOUNTER — Other Ambulatory Visit (HOSPITAL_COMMUNITY): Payer: Self-pay

## 2020-11-20 MED FILL — Metoprolol Tartrate Tab 50 MG: ORAL | 90 days supply | Qty: 90 | Fill #0 | Status: AC

## 2020-11-20 MED FILL — Meloxicam Tab 15 MG: ORAL | 90 days supply | Qty: 90 | Fill #0 | Status: AC

## 2020-11-20 MED FILL — Hydrochlorothiazide Tab 25 MG: ORAL | 90 days supply | Qty: 90 | Fill #0 | Status: AC

## 2020-11-21 ENCOUNTER — Other Ambulatory Visit (HOSPITAL_COMMUNITY): Payer: Self-pay

## 2020-11-22 ENCOUNTER — Other Ambulatory Visit (HOSPITAL_COMMUNITY): Payer: Self-pay

## 2020-12-18 ENCOUNTER — Other Ambulatory Visit (HOSPITAL_COMMUNITY): Payer: Self-pay

## 2020-12-18 MED ORDER — CELECOXIB 200 MG PO CAPS
200.0000 mg | ORAL_CAPSULE | Freq: Every day | ORAL | 3 refills | Status: AC
  Filled 2020-12-18: qty 90, 90d supply, fill #0
  Filled 2021-03-14: qty 90, 90d supply, fill #1
  Filled 2021-06-12: qty 90, 90d supply, fill #2
  Filled 2021-09-17: qty 90, 90d supply, fill #3

## 2020-12-18 MED ORDER — HYDROCHLOROTHIAZIDE 25 MG PO TABS
1.0000 | ORAL_TABLET | Freq: Every day | ORAL | 3 refills | Status: DC
Start: 2020-12-18 — End: 2021-12-14
  Filled 2020-12-18 – 2021-02-12 (×2): qty 90, 90d supply, fill #0
  Filled 2021-05-13: qty 90, 90d supply, fill #1
  Filled 2021-08-21: qty 90, 90d supply, fill #2
  Filled 2021-11-19: qty 90, 90d supply, fill #3

## 2020-12-18 MED ORDER — METOPROLOL TARTRATE 50 MG PO TABS
50.0000 mg | ORAL_TABLET | Freq: Every day | ORAL | 3 refills | Status: DC
  Filled 2020-12-18 – 2021-02-12 (×2): qty 90, 90d supply, fill #0
  Filled 2021-05-13: qty 90, 90d supply, fill #1
  Filled 2021-08-21: qty 90, 90d supply, fill #2
  Filled 2021-11-19: qty 90, 90d supply, fill #3

## 2021-02-13 ENCOUNTER — Other Ambulatory Visit (HOSPITAL_COMMUNITY): Payer: Self-pay

## 2021-02-13 ENCOUNTER — Other Ambulatory Visit: Payer: Self-pay | Admitting: *Deleted

## 2021-02-13 DIAGNOSIS — Z1231 Encounter for screening mammogram for malignant neoplasm of breast: Secondary | ICD-10-CM

## 2021-03-14 ENCOUNTER — Other Ambulatory Visit (HOSPITAL_COMMUNITY): Payer: Self-pay

## 2021-03-22 ENCOUNTER — Ambulatory Visit
Admission: RE | Admit: 2021-03-22 | Discharge: 2021-03-22 | Disposition: A | Discharge status: Home / Self Care | Payer: Medicare HMO | Source: Ambulatory Visit | Attending: *Deleted | Admitting: *Deleted

## 2021-03-22 ENCOUNTER — Other Ambulatory Visit: Payer: Self-pay

## 2021-03-22 DIAGNOSIS — Z1231 Encounter for screening mammogram for malignant neoplasm of breast: Secondary | ICD-10-CM

## 2021-03-26 ENCOUNTER — Other Ambulatory Visit: Payer: Self-pay | Admitting: *Deleted

## 2021-03-26 DIAGNOSIS — R928 Other abnormal and inconclusive findings on diagnostic imaging of breast: Secondary | ICD-10-CM

## 2021-04-11 ENCOUNTER — Ambulatory Visit
Admission: RE | Admit: 2021-04-11 | Discharge: 2021-04-11 | Disposition: A | Discharge status: Home / Self Care | Payer: Medicare HMO | Source: Ambulatory Visit | Attending: *Deleted | Admitting: *Deleted

## 2021-04-11 ENCOUNTER — Other Ambulatory Visit: Payer: Self-pay

## 2021-04-11 DIAGNOSIS — R928 Other abnormal and inconclusive findings on diagnostic imaging of breast: Secondary | ICD-10-CM

## 2021-05-14 ENCOUNTER — Other Ambulatory Visit (HOSPITAL_COMMUNITY): Payer: Self-pay

## 2021-06-12 ENCOUNTER — Other Ambulatory Visit (HOSPITAL_COMMUNITY): Payer: Self-pay

## 2021-08-21 ENCOUNTER — Other Ambulatory Visit (HOSPITAL_COMMUNITY): Payer: Self-pay

## 2021-09-17 ENCOUNTER — Other Ambulatory Visit (HOSPITAL_COMMUNITY): Payer: Self-pay

## 2021-11-19 ENCOUNTER — Other Ambulatory Visit (HOSPITAL_COMMUNITY): Payer: Self-pay

## 2021-12-14 ENCOUNTER — Other Ambulatory Visit (HOSPITAL_COMMUNITY): Payer: Self-pay

## 2021-12-14 MED ORDER — MELOXICAM 15 MG PO TABS
15.0000 mg | ORAL_TABLET | Freq: Every day | ORAL | 1 refills | Status: DC
  Filled 2021-12-14 (×2): qty 90, 90d supply, fill #0

## 2021-12-14 MED ORDER — METOPROLOL TARTRATE 50 MG PO TABS
50.0000 mg | ORAL_TABLET | Freq: Every day | ORAL | 1 refills | Status: AC
  Filled 2021-12-14 – 2022-02-19 (×2): qty 90, 90d supply, fill #0
  Filled 2022-05-26: qty 90, 90d supply, fill #1

## 2021-12-14 MED ORDER — HYDROCHLOROTHIAZIDE 25 MG PO TABS
25.0000 mg | ORAL_TABLET | Freq: Every day | ORAL | 1 refills | Status: AC
  Filled 2021-12-14 – 2022-02-19 (×2): qty 90, 90d supply, fill #0
  Filled 2022-05-26: qty 90, 90d supply, fill #1

## 2021-12-17 ENCOUNTER — Other Ambulatory Visit (HOSPITAL_COMMUNITY): Payer: Self-pay

## 2021-12-17 MED ORDER — DICLOFENAC SODIUM 75 MG PO TBEC
75.0000 mg | DELAYED_RELEASE_TABLET | Freq: Two times a day (BID) | ORAL | 1 refills | Status: DC
  Filled 2021-12-17: qty 180, 90d supply, fill #0
  Filled 2022-03-21: qty 180, 90d supply, fill #1

## 2021-12-24 ENCOUNTER — Other Ambulatory Visit: Payer: Self-pay | Admitting: *Deleted

## 2021-12-24 DIAGNOSIS — M810 Age-related osteoporosis without current pathological fracture: Secondary | ICD-10-CM

## 2022-01-15 ENCOUNTER — Other Ambulatory Visit: Payer: Self-pay | Admitting: *Deleted

## 2022-01-15 DIAGNOSIS — Z1231 Encounter for screening mammogram for malignant neoplasm of breast: Secondary | ICD-10-CM

## 2022-02-20 ENCOUNTER — Other Ambulatory Visit (HOSPITAL_COMMUNITY): Payer: Self-pay

## 2022-03-21 ENCOUNTER — Other Ambulatory Visit (HOSPITAL_COMMUNITY): Payer: Self-pay

## 2022-03-22 ENCOUNTER — Other Ambulatory Visit (HOSPITAL_COMMUNITY): Payer: Self-pay

## 2022-03-25 ENCOUNTER — Ambulatory Visit: Payer: Medicare HMO

## 2022-04-03 ENCOUNTER — Ambulatory Visit
Admission: RE | Admit: 2022-04-03 | Discharge: 2022-04-03 | Disposition: A | Discharge status: Home / Self Care | Payer: Medicare HMO | Source: Ambulatory Visit | Attending: *Deleted | Admitting: *Deleted

## 2022-04-03 DIAGNOSIS — Z1231 Encounter for screening mammogram for malignant neoplasm of breast: Secondary | ICD-10-CM

## 2022-05-24 ENCOUNTER — Other Ambulatory Visit: Payer: Self-pay

## 2022-05-27 ENCOUNTER — Other Ambulatory Visit (HOSPITAL_COMMUNITY): Payer: Self-pay

## 2022-06-22 ENCOUNTER — Other Ambulatory Visit (HOSPITAL_COMMUNITY): Payer: Self-pay

## 2022-06-25 ENCOUNTER — Other Ambulatory Visit: Payer: Medicare HMO

## 2022-07-05 ENCOUNTER — Other Ambulatory Visit (HOSPITAL_COMMUNITY): Payer: Self-pay

## 2022-07-08 ENCOUNTER — Other Ambulatory Visit (HOSPITAL_COMMUNITY): Payer: Self-pay

## 2022-07-08 MED ORDER — DICLOFENAC SODIUM 75 MG PO TBEC
75.0000 mg | DELAYED_RELEASE_TABLET | Freq: Two times a day (BID) | ORAL | 1 refills | Status: AC
  Filled 2022-07-08: qty 180, 90d supply, fill #0
  Filled 2022-10-08: qty 180, 90d supply, fill #1

## 2022-08-16 ENCOUNTER — Other Ambulatory Visit (HOSPITAL_COMMUNITY): Payer: Self-pay

## 2022-08-16 MED ORDER — DICLOFENAC SODIUM 75 MG PO TBEC: 75.0000 mg | DELAYED_RELEASE_TABLET | Freq: Two times a day (BID) | ORAL | 1 refills | Status: AC

## 2022-09-04 ENCOUNTER — Other Ambulatory Visit (HOSPITAL_COMMUNITY): Payer: Self-pay

## 2022-09-04 MED ORDER — METOPROLOL TARTRATE 50 MG PO TABS
50.0000 mg | ORAL_TABLET | Freq: Every day | ORAL | 1 refills | Status: AC
  Filled 2022-09-04: qty 90, 90d supply, fill #0
  Filled 2022-11-19: qty 90, 90d supply, fill #1

## 2022-09-04 MED ORDER — HYDROCHLOROTHIAZIDE 25 MG PO TABS
25.0000 mg | ORAL_TABLET | Freq: Every day | ORAL | 1 refills | Status: AC
  Filled 2022-09-04: qty 90, 90d supply, fill #0
  Filled 2022-11-19: qty 90, 90d supply, fill #1

## 2022-09-17 ENCOUNTER — Other Ambulatory Visit (HOSPITAL_COMMUNITY): Payer: Self-pay

## 2022-09-17 MED ORDER — ROSUVASTATIN CALCIUM 10 MG PO TABS
10.0000 mg | ORAL_TABLET | Freq: Every day | ORAL | 1 refills | Status: DC
  Filled 2022-09-17: qty 90, 90d supply, fill #0
  Filled 2022-12-17: qty 90, 90d supply, fill #1

## 2022-12-10 ENCOUNTER — Other Ambulatory Visit: Payer: Self-pay | Admitting: *Deleted

## 2022-12-10 DIAGNOSIS — M810 Age-related osteoporosis without current pathological fracture: Secondary | ICD-10-CM

## 2022-12-13 ENCOUNTER — Other Ambulatory Visit: Payer: Medicare HMO

## 2023-01-08 ENCOUNTER — Other Ambulatory Visit (HOSPITAL_COMMUNITY): Payer: Self-pay

## 2023-01-08 MED ORDER — DICLOFENAC SODIUM 75 MG PO TBEC
75.0000 mg | DELAYED_RELEASE_TABLET | Freq: Every day | ORAL | 1 refills | Status: AC
  Filled 2023-01-08: qty 90, 90d supply, fill #0

## 2023-02-20 ENCOUNTER — Other Ambulatory Visit (HOSPITAL_COMMUNITY): Payer: Self-pay

## 2023-02-20 MED ORDER — HYDROCHLOROTHIAZIDE 25 MG PO TABS
25.0000 mg | ORAL_TABLET | Freq: Every day | ORAL | 1 refills | Status: DC
  Filled 2023-02-20: qty 90, 90d supply, fill #0
  Filled 2023-05-29: qty 90, 90d supply, fill #1

## 2023-02-20 MED ORDER — ROSUVASTATIN CALCIUM 10 MG PO TABS
10.0000 mg | ORAL_TABLET | Freq: Every day | ORAL | 1 refills | Status: DC
  Filled 2023-02-20 – 2023-03-17 (×2): qty 90, 90d supply, fill #0
  Filled 2023-06-29: qty 90, 90d supply, fill #1

## 2023-02-20 MED ORDER — METOPROLOL TARTRATE 50 MG PO TABS
50.0000 mg | ORAL_TABLET | Freq: Every day | ORAL | 1 refills | Status: DC
  Filled 2023-02-20: qty 90, 90d supply, fill #0
  Filled 2023-05-29: qty 90, 90d supply, fill #1

## 2023-02-20 MED ORDER — DICLOFENAC SODIUM 75 MG PO TBEC
75.0000 mg | DELAYED_RELEASE_TABLET | Freq: Three times a day (TID) | ORAL | 1 refills | Status: AC
  Filled 2023-02-24 (×2): qty 270, 90d supply, fill #0
  Filled 2023-07-06: qty 270, 90d supply, fill #1

## 2023-02-24 ENCOUNTER — Other Ambulatory Visit (HOSPITAL_COMMUNITY): Payer: Self-pay

## 2023-02-25 ENCOUNTER — Other Ambulatory Visit (HOSPITAL_COMMUNITY): Payer: Self-pay

## 2023-03-17 ENCOUNTER — Other Ambulatory Visit (HOSPITAL_COMMUNITY): Payer: Self-pay

## 2023-03-19 ENCOUNTER — Other Ambulatory Visit: Payer: Self-pay | Admitting: *Deleted

## 2023-03-19 DIAGNOSIS — Z0000 Encounter for general adult medical examination without abnormal findings: Secondary | ICD-10-CM

## 2023-04-09 ENCOUNTER — Ambulatory Visit: Payer: Medicare HMO

## 2023-04-30 ENCOUNTER — Ambulatory Visit
Admission: RE | Admit: 2023-04-30 | Discharge: 2023-04-30 | Disposition: A | Discharge status: Home / Self Care | Payer: Medicare HMO | Source: Ambulatory Visit | Attending: *Deleted | Admitting: *Deleted

## 2023-04-30 DIAGNOSIS — Z0000 Encounter for general adult medical examination without abnormal findings: Secondary | ICD-10-CM

## 2023-06-02 ENCOUNTER — Other Ambulatory Visit (HOSPITAL_COMMUNITY): Payer: Self-pay

## 2023-07-01 ENCOUNTER — Ambulatory Visit
Admission: RE | Admit: 2023-07-01 | Discharge: 2023-07-01 | Disposition: A | Discharge status: Home / Self Care | Payer: Medicare HMO | Source: Ambulatory Visit | Attending: *Deleted | Admitting: *Deleted

## 2023-07-01 DIAGNOSIS — M810 Age-related osteoporosis without current pathological fracture: Secondary | ICD-10-CM

## 2023-08-26 ENCOUNTER — Other Ambulatory Visit (HOSPITAL_COMMUNITY): Payer: Self-pay

## 2023-08-26 MED ORDER — HYDROCHLOROTHIAZIDE 25 MG PO TABS
25.0000 mg | ORAL_TABLET | Freq: Every day | ORAL | 1 refills | Status: DC
  Filled 2023-08-26: qty 90, 90d supply, fill #0
  Filled 2023-12-01: qty 90, 90d supply, fill #1

## 2023-08-26 MED ORDER — DICLOFENAC SODIUM 75 MG PO TBEC
75.0000 mg | DELAYED_RELEASE_TABLET | Freq: Three times a day (TID) | ORAL | 1 refills | Status: AC
  Filled 2023-11-25: qty 270, 90d supply, fill #0

## 2023-08-26 MED ORDER — ROSUVASTATIN CALCIUM 10 MG PO TABS
10.0000 mg | ORAL_TABLET | Freq: Every day | ORAL | 1 refills | Status: DC
  Filled 2023-08-26 – 2023-10-12 (×2): qty 90, 90d supply, fill #0
  Filled 2024-01-14: qty 90, 90d supply, fill #1

## 2023-08-26 MED ORDER — METOPROLOL TARTRATE 50 MG PO TABS
50.0000 mg | ORAL_TABLET | Freq: Every day | ORAL | 1 refills | Status: DC
  Filled 2023-08-26: qty 90, 90d supply, fill #0
  Filled 2023-12-01: qty 90, 90d supply, fill #1

## 2023-09-01 ENCOUNTER — Other Ambulatory Visit (HOSPITAL_COMMUNITY): Payer: Self-pay

## 2023-10-13 ENCOUNTER — Other Ambulatory Visit (HOSPITAL_COMMUNITY): Payer: Self-pay

## 2023-11-25 ENCOUNTER — Other Ambulatory Visit (HOSPITAL_COMMUNITY): Payer: Self-pay

## 2023-11-26 ENCOUNTER — Other Ambulatory Visit (HOSPITAL_COMMUNITY): Payer: Self-pay

## 2024-01-08 ENCOUNTER — Other Ambulatory Visit (HOSPITAL_COMMUNITY): Payer: Self-pay

## 2024-03-02 ENCOUNTER — Other Ambulatory Visit (HOSPITAL_COMMUNITY): Payer: Self-pay

## 2024-03-02 MED ORDER — DICLOFENAC SODIUM 75 MG PO TBEC
75.0000 mg | DELAYED_RELEASE_TABLET | Freq: Three times a day (TID) | ORAL | 1 refills | Status: AC
  Filled 2024-03-02: qty 270, 90d supply, fill #0
  Filled 2024-08-12: qty 270, 90d supply, fill #1

## 2024-03-02 MED ORDER — HYDROCHLOROTHIAZIDE 25 MG PO TABS
25.0000 mg | ORAL_TABLET | Freq: Every day | ORAL | 1 refills | Status: DC
  Filled 2024-03-02: qty 90, 90d supply, fill #0
  Filled 2024-05-30: qty 90, 90d supply, fill #1

## 2024-03-02 MED ORDER — ROSUVASTATIN CALCIUM 10 MG PO TABS
10.0000 mg | ORAL_TABLET | Freq: Every day | ORAL | 1 refills | Status: AC
  Filled 2024-03-02 – 2024-04-17 (×2): qty 90, 90d supply, fill #0
  Filled 2024-08-12: qty 90, 90d supply, fill #1

## 2024-03-02 MED ORDER — METOPROLOL TARTRATE 50 MG PO TABS
50.0000 mg | ORAL_TABLET | Freq: Every day | ORAL | 1 refills | Status: DC
  Filled 2024-03-02: qty 90, 90d supply, fill #0
  Filled 2024-05-30: qty 90, 90d supply, fill #1

## 2024-03-18 ENCOUNTER — Other Ambulatory Visit: Payer: Self-pay | Admitting: *Deleted

## 2024-03-18 DIAGNOSIS — Z1231 Encounter for screening mammogram for malignant neoplasm of breast: Secondary | ICD-10-CM

## 2024-04-17 ENCOUNTER — Other Ambulatory Visit (HOSPITAL_COMMUNITY): Payer: Self-pay

## 2024-04-19 ENCOUNTER — Other Ambulatory Visit (HOSPITAL_COMMUNITY): Payer: Self-pay

## 2024-04-30 ENCOUNTER — Ambulatory Visit
Admission: RE | Admit: 2024-04-30 | Discharge: 2024-04-30 | Disposition: A | Discharge status: Home / Self Care | Source: Ambulatory Visit | Attending: *Deleted | Admitting: *Deleted

## 2024-04-30 DIAGNOSIS — Z1231 Encounter for screening mammogram for malignant neoplasm of breast: Secondary | ICD-10-CM

## 2024-06-02 ENCOUNTER — Other Ambulatory Visit (HOSPITAL_COMMUNITY): Payer: Self-pay

## 2024-08-17 ENCOUNTER — Other Ambulatory Visit (HOSPITAL_COMMUNITY): Payer: Self-pay

## 2024-08-19 ENCOUNTER — Other Ambulatory Visit: Payer: Self-pay

## 2024-08-19 ENCOUNTER — Other Ambulatory Visit (HOSPITAL_COMMUNITY): Payer: Self-pay

## 2024-08-19 MED ORDER — HYDROCHLOROTHIAZIDE 25 MG PO TABS
25.0000 mg | ORAL_TABLET | Freq: Every day | ORAL | 1 refills | Status: AC
  Filled 2024-08-19: qty 90, 90d supply, fill #0

## 2024-08-19 MED ORDER — DICLOFENAC SODIUM 75 MG PO TBEC: 75.0000 mg | DELAYED_RELEASE_TABLET | Freq: Three times a day (TID) | ORAL | 1 refills | Status: AC

## 2024-08-19 MED ORDER — ROSUVASTATIN CALCIUM 10 MG PO TABS
10.0000 mg | ORAL_TABLET | Freq: Every day | ORAL | 1 refills | Status: AC
  Filled 2024-08-19: qty 90, 90d supply, fill #0

## 2024-08-19 MED ORDER — METOPROLOL TARTRATE 50 MG PO TABS
50.0000 mg | ORAL_TABLET | Freq: Every day | ORAL | 1 refills | Status: AC
  Filled 2024-08-19: qty 90, 90d supply, fill #0
